# Patient Record
Sex: Male | Born: 1975 | Race: Black or African American | Hispanic: No | Marital: Single | Smoking: Never smoker
Health system: Southern US, Community
[De-identification: ages and names within clinical notes are randomized; demographics above are authoritative.]

---

## 1998-02-20 ENCOUNTER — Emergency Department (HOSPITAL_COMMUNITY): Admission: EM | Admit: 1998-02-20 | Discharge: 1998-02-20 | Payer: Self-pay | Admitting: Internal Medicine

## 2020-02-23 ENCOUNTER — Emergency Department (HOSPITAL_COMMUNITY)
Admission: EM | Admit: 2020-02-23 | Discharge: 2020-02-23 | Disposition: A | Discharge status: Home / Self Care | Payer: Self-pay | Attending: Emergency Medicine | Admitting: Emergency Medicine

## 2020-02-23 ENCOUNTER — Encounter (HOSPITAL_COMMUNITY): Payer: Self-pay

## 2020-02-23 DIAGNOSIS — K0889 Other specified disorders of teeth and supporting structures: Secondary | ICD-10-CM | POA: Insufficient documentation

## 2020-02-23 MED ORDER — PENICILLIN V POTASSIUM 500 MG PO TABS: 500.0000 mg | ORAL_TABLET | Freq: Four times a day (QID) | ORAL | 0 refills | Status: AC

## 2020-02-23 MED ORDER — MELOXICAM 7.5 MG PO TABS: 7.5000 mg | ORAL_TABLET | Freq: Every day | ORAL | 0 refills | Status: AC

## 2020-02-23 NOTE — ED Triage Notes (Signed)
Pt arrives POV for eval of L sided dental/tooth pain onset yesterday afternoon. Reports radiating to L ear, jaw, eye. States he took 4 tylenol today

## 2020-02-23 NOTE — ED Provider Notes (Signed)
[facility] EMERGENCY DEPARTMENT Provider Note   CSN: [id number] Arrival date & time: 02/23/20  1330     History Chief Complaint  Patient presents with  . Dental Pain    Glenn Barnes is a 44 y.o. male.  44yo male with complaint of left upper dental pain onset yesterday, states his tooth cracked.  Denies trauma, fever, drainage.  Patient called his dentist but was advised you need any antibiotic before they could see him.  No allergies, took Tylenol without improvement.  No other complaints or concerns.        History reviewed. No pertinent past medical history.  There are no problems to display for this patient.   History reviewed. No pertinent surgical history.     No family history on file.  Social History   Tobacco Use  . Smoking status: Never Smoker  . Smokeless tobacco: Never Used  Substance Use Topics  . Alcohol use: Yes  . Drug use: Yes    Types: Marijuana    Home Medications Prior to Admission medications   Medication Sig Start Date End Date Taking? Authorizing Provider  meloxicam (MOBIC) 7.5 MG tablet Take 1 tablet (7.5 mg total) by mouth daily for 10 days. 02/23/20 03/04/20  Army Melia A, PA-C  penicillin v potassium (VEETID) 500 MG tablet Take 1 tablet (500 mg total) by mouth 4 (four) times daily for 7 days. 02/23/20 03/01/20  Jeannie Fend, PA-C    Allergies    Patient has no known allergies.  Review of Systems   Review of Systems  Constitutional: Negative for fever.  HENT: Positive for dental problem. Negative for ear pain, facial swelling, trouble swallowing and voice change.   Gastrointestinal: Negative for nausea and vomiting.  Musculoskeletal: Negative for neck pain.  Skin: Negative for rash and wound.  Allergic/Immunologic: Negative for immunocompromised state.  Neurological: Negative for headaches.  Hematological: Negative for adenopathy.  Psychiatric/Behavioral: Negative for confusion.  All other systems reviewed  and are negative.   Physical Exam Updated Vital Signs BP (!) 139/91   Pulse 64   Temp 98.2 F (36.8 C) (Oral)   Resp 15   Ht 5\' 8"  (1.727 m)   Wt 102.1 kg   SpO2 100%   BMI 34.21 kg/m   Physical Exam Vitals and nursing note reviewed.  Constitutional:      General: He is not in acute distress.    Appearance: He is well-developed. He is not diaphoretic.  HENT:     Head: Normocephalic and atraumatic.     Jaw: No trismus.     Nose: Nose normal.     Mouth/Throat:     Mouth: Mucous membranes are moist.     Dentition: Normal dentition. No gingival swelling, dental caries or dental abscesses.     Pharynx: Uvula midline. No uvula swelling.   Pulmonary:     Effort: Pulmonary effort is normal.  Musculoskeletal:     Cervical back: Normal range of motion and neck supple. No tenderness.  Skin:    General: Skin is warm and dry.     Findings: No erythema or rash.  Neurological:     Mental Status: He is alert and oriented to person, place, and time.  Psychiatric:        Behavior: Behavior normal.     ED Results / Procedures / Treatments   Labs (all labs ordered are listed, but only abnormal results are displayed) Labs Reviewed - No data to display  EKG None  Radiology No results found.  Procedures Procedures (including critical care time)  Medications Ordered in ED Medications - No data to display  ED Course  I have reviewed the triage vital signs and the nursing notes.  Pertinent labs & imaging results that were available during my care of the patient were reviewed by me and considered in my medical decision making (see chart for details).    MDM Rules/Calculators/A&P                          Final Clinical Impression(s) / ED Diagnoses Final diagnoses:  Pain, dental    Rx / DC Orders ED Discharge Orders         Ordered    penicillin v potassium (VEETID) 500 MG tablet  4 times daily     Discontinue  Reprint     02/23/20 1900    meloxicam (MOBIC) 7.5 MG  tablet  Daily     Discontinue  Reprint     02/23/20 1900           Jeannie Fend, PA-C 02/23/20 1904    Arby Barrette, MD 02/24/20 [phone]

## 2021-02-19 ENCOUNTER — Encounter (HOSPITAL_COMMUNITY): Payer: Self-pay | Admitting: Emergency Medicine

## 2021-02-19 ENCOUNTER — Emergency Department (HOSPITAL_COMMUNITY): Payer: Self-pay

## 2021-02-19 ENCOUNTER — Ambulatory Visit (HOSPITAL_COMMUNITY)
Admission: EM | Admit: 2021-02-19 | Discharge: 2021-02-19 | Disposition: A | Discharge status: Home / Self Care | Payer: Self-pay | Attending: Emergency Medicine | Admitting: Emergency Medicine

## 2021-02-19 ENCOUNTER — Other Ambulatory Visit: Payer: Self-pay

## 2021-02-19 ENCOUNTER — Encounter (HOSPITAL_COMMUNITY)
Admission: EM | Disposition: A | Discharge status: Home / Self Care | Payer: Self-pay | Source: Home / Self Care | Attending: Emergency Medicine

## 2021-02-19 ENCOUNTER — Emergency Department (HOSPITAL_COMMUNITY): Payer: Self-pay | Admitting: Anesthesiology

## 2021-02-19 DIAGNOSIS — Y9355 Activity, bike riding: Secondary | ICD-10-CM | POA: Insufficient documentation

## 2021-02-19 DIAGNOSIS — Z20822 Contact with and (suspected) exposure to covid-19: Secondary | ICD-10-CM | POA: Insufficient documentation

## 2021-02-19 DIAGNOSIS — T148XXA Other injury of unspecified body region, initial encounter: Secondary | ICD-10-CM

## 2021-02-19 DIAGNOSIS — S52391A Other fracture of shaft of radius, right arm, initial encounter for closed fracture: Secondary | ICD-10-CM

## 2021-02-19 DIAGNOSIS — S52371A Galeazzi's fracture of right radius, initial encounter for closed fracture: Secondary | ICD-10-CM | POA: Insufficient documentation

## 2021-02-19 HISTORY — PX: OPEN REDUCTION INTERNAL FIXATION (ORIF) DISTAL RADIAL FRACTURE: SHX5989

## 2021-02-19 LAB — RESP PANEL BY RT-PCR (FLU A&B, COVID) ARPGX2
Influenza A by PCR: NEGATIVE
Influenza B by PCR: NEGATIVE
SARS Coronavirus 2 by RT PCR: NEGATIVE

## 2021-02-19 LAB — CBC WITH DIFFERENTIAL/PLATELET
Abs Immature Granulocytes: 0.02 10*3/uL (ref 0.00–0.07)
Basophils Absolute: 0 10*3/uL (ref 0.0–0.1)
Basophils Relative: 1 %
Eosinophils Absolute: 0.1 10*3/uL (ref 0.0–0.5)
Eosinophils Relative: 1 %
HCT: 48.6 % (ref 39.0–52.0)
Hemoglobin: 16.3 g/dL (ref 13.0–17.0)
Immature Granulocytes: 0 %
Lymphocytes Relative: 16 %
Lymphs Abs: 1 10*3/uL (ref 0.7–4.0)
MCH: 27.6 pg (ref 26.0–34.0)
MCHC: 33.5 g/dL (ref 30.0–36.0)
MCV: 82.4 fL (ref 80.0–100.0)
Monocytes Absolute: 0.4 10*3/uL (ref 0.1–1.0)
Monocytes Relative: 7 %
Neutro Abs: 4.5 10*3/uL (ref 1.7–7.7)
Neutrophils Relative %: 75 %
Platelets: 201 10*3/uL (ref 150–400)
RBC: 5.9 MIL/uL — ABNORMAL HIGH (ref 4.22–5.81)
RDW: 14.8 % (ref 11.5–15.5)
WBC: 6 10*3/uL (ref 4.0–10.5)
nRBC: 0 % (ref 0.0–0.2)

## 2021-02-19 LAB — BASIC METABOLIC PANEL
Anion gap: 10 (ref 5–15)
BUN: 13 mg/dL (ref 6–20)
CO2: 25 mmol/L (ref 22–32)
Calcium: 9.9 mg/dL (ref 8.9–10.3)
Chloride: 109 mmol/L (ref 98–111)
Creatinine, Ser: 1.25 mg/dL — ABNORMAL HIGH (ref 0.61–1.24)
GFR, Estimated: >60 mL/min (ref >60)
Glucose, Bld: 101 mg/dL — ABNORMAL HIGH (ref 70–99)
Potassium: 4.7 mmol/L (ref 3.5–5.1)
Sodium: 144 mmol/L (ref 135–145)

## 2021-02-19 SURGERY — OPEN REDUCTION INTERNAL FIXATION (ORIF) DISTAL RADIUS FRACTURE
Anesthesia: Monitor Anesthesia Care | Laterality: Right

## 2021-02-19 MED ORDER — MIDAZOLAM HCL 2 MG/2ML IJ SOLN
INTRAMUSCULAR | Status: AC
  Filled 2021-02-19: qty 2

## 2021-02-19 MED ORDER — ONDANSETRON HCL 4 MG/2ML IJ SOLN
INTRAMUSCULAR | Status: DC | PRN
  Administered 2021-02-19: 4 mg via INTRAVENOUS

## 2021-02-19 MED ORDER — CHLORHEXIDINE GLUCONATE 4 % EX LIQD: 60.0000 mL | Freq: Once | CUTANEOUS | Status: DC

## 2021-02-19 MED ORDER — BUPIVACAINE HCL (PF) 0.5 % IJ SOLN
INTRAMUSCULAR | Status: AC
  Filled 2021-02-19: qty 30

## 2021-02-19 MED ORDER — ACETAMINOPHEN 325 MG PO TABS: 650.0000 mg | ORAL_TABLET | Freq: Two times a day (BID) | ORAL | 0 refills | Status: AC

## 2021-02-19 MED ORDER — METHOCARBAMOL 500 MG PO TABS: 500.0000 mg | ORAL_TABLET | Freq: Three times a day (TID) | ORAL | 0 refills | Status: AC | PRN

## 2021-02-19 MED ORDER — DEXAMETHASONE SODIUM PHOSPHATE 10 MG/ML IJ SOLN
INTRAMUSCULAR | Status: DC | PRN
  Administered 2021-02-19: 5 mg via INTRAVENOUS

## 2021-02-19 MED ORDER — PROMETHAZINE HCL 25 MG/ML IJ SOLN: 6.2500 mg | INTRAMUSCULAR | Status: DC | PRN

## 2021-02-19 MED ORDER — DROPERIDOL 2.5 MG/ML IJ SOLN: 0.6250 mg | Freq: Once | INTRAMUSCULAR | Status: DC | PRN

## 2021-02-19 MED ORDER — MORPHINE SULFATE (PF) 4 MG/ML IV SOLN
8.0000 mg | Freq: Once | INTRAVENOUS | Status: AC
  Administered 2021-02-19: 8 mg via INTRAMUSCULAR
  Filled 2021-02-19: qty 2

## 2021-02-19 MED ORDER — ONDANSETRON 4 MG PO TBDP: 4.0000 mg | ORAL_TABLET | Freq: Three times a day (TID) | ORAL | 0 refills | Status: AC | PRN

## 2021-02-19 MED ORDER — FENTANYL CITRATE (PF) 100 MCG/2ML IJ SOLN: 25.0000 ug | INTRAMUSCULAR | Status: DC | PRN

## 2021-02-19 MED ORDER — ROPIVACAINE HCL 5 MG/ML IJ SOLN
INTRAMUSCULAR | Status: DC | PRN
  Administered 2021-02-19: 30 mL via PERINEURAL

## 2021-02-19 MED ORDER — LACTATED RINGERS IV SOLN: INTRAVENOUS | Status: DC | PRN

## 2021-02-19 MED ORDER — OXYCODONE HCL 5 MG/5ML PO SOLN: 5.0000 mg | Freq: Once | ORAL | Status: DC | PRN

## 2021-02-19 MED ORDER — LIDOCAINE HCL (CARDIAC) PF 100 MG/5ML IV SOSY
PREFILLED_SYRINGE | INTRAVENOUS | Status: DC | PRN
  Administered 2021-02-19: 60 mg via INTRAVENOUS

## 2021-02-19 MED ORDER — PROPOFOL 10 MG/ML IV BOLUS
INTRAVENOUS | Status: DC | PRN
  Administered 2021-02-19: 40 mg via INTRAVENOUS

## 2021-02-19 MED ORDER — OXYCODONE-ACETAMINOPHEN 5-325 MG PO TABS: 1.0000 | ORAL_TABLET | Freq: Four times a day (QID) | ORAL | 0 refills | Status: AC | PRN

## 2021-02-19 MED ORDER — FENTANYL CITRATE (PF) 100 MCG/2ML IJ SOLN
INTRAMUSCULAR | Status: AC
  Filled 2021-02-19: qty 2

## 2021-02-19 MED ORDER — PROPOFOL 500 MG/50ML IV EMUL
INTRAVENOUS | Status: DC | PRN
  Administered 2021-02-19: 80 ug/kg/min via INTRAVENOUS

## 2021-02-19 MED ORDER — CEFAZOLIN SODIUM-DEXTROSE 2-4 GM/100ML-% IV SOLN
INTRAVENOUS | Status: AC
  Filled 2021-02-19: qty 100

## 2021-02-19 MED ORDER — CEFAZOLIN SODIUM-DEXTROSE 2-4 GM/100ML-% IV SOLN
2.0000 g | INTRAVENOUS | Status: AC
  Administered 2021-02-19: 2 g via INTRAVENOUS

## 2021-02-19 MED ORDER — SODIUM CHLORIDE 0.9 % IV SOLN: INTRAVENOUS | Status: DC

## 2021-02-19 MED ORDER — OXYCODONE HCL 5 MG PO TABS: 5.0000 mg | ORAL_TABLET | Freq: Once | ORAL | Status: DC | PRN

## 2021-02-19 MED ORDER — POVIDONE-IODINE 10 % EX SWAB: 2.0000 "application " | Freq: Once | CUTANEOUS | Status: DC

## 2021-02-19 MED ORDER — MORPHINE SULFATE (PF) 4 MG/ML IV SOLN
6.0000 mg | Freq: Once | INTRAVENOUS | Status: AC
  Administered 2021-02-19: 6 mg via INTRAMUSCULAR
  Filled 2021-02-19: qty 2

## 2021-02-19 MED ORDER — MIDAZOLAM HCL 2 MG/2ML IJ SOLN
INTRAMUSCULAR | Status: DC | PRN
  Administered 2021-02-19: 2 mg via INTRAVENOUS

## 2021-02-19 MED ORDER — FENTANYL CITRATE (PF) 250 MCG/5ML IJ SOLN
INTRAMUSCULAR | Status: DC | PRN
  Administered 2021-02-19 (×2): 50 ug via INTRAVENOUS

## 2021-02-19 MED ORDER — 0.9 % SODIUM CHLORIDE (POUR BTL) OPTIME
TOPICAL | Status: DC | PRN
  Administered 2021-02-19: 1000 mL

## 2021-02-19 SURGICAL SUPPLY — 70 items
BAG COUNTER SPONGE SURGICOUNT (BAG) ×2 IMPLANT
BAG DECANTER FOR FLEXI CONT (MISCELLANEOUS) IMPLANT
BENZOIN TINCTURE PRP APPL 2/3 (GAUZE/BANDAGES/DRESSINGS) ×2 IMPLANT
BIT DRILL 2.5X2.75 QC CALB (BIT) ×2 IMPLANT
BIT DRILL CALIBRATED 2.7 (BIT) ×2 IMPLANT
BLADE SURG 15 STRL LF DISP TIS (BLADE) ×2 IMPLANT
BLADE SURG 15 STRL SS (BLADE) ×4
BNDG CONFORM 4 STRL LF (GAUZE/BANDAGES/DRESSINGS) ×2 IMPLANT
BNDG ELASTIC 3X5.8 VLCR STR LF (GAUZE/BANDAGES/DRESSINGS) ×2 IMPLANT
BNDG ELASTIC 4X5.8 VLCR STR LF (GAUZE/BANDAGES/DRESSINGS) ×2 IMPLANT
BNDG ESMARK 4X9 LF (GAUZE/BANDAGES/DRESSINGS) ×2 IMPLANT
BNDG PLASTER X FAST 3X3 WHT LF (CAST SUPPLIES) IMPLANT
CANISTER SUCT 3000ML PPV (MISCELLANEOUS) IMPLANT
COVER BACK TABLE 60X90IN (DRAPES) ×2 IMPLANT
CUFF TOURN SGL QUICK 18X4 (TOURNIQUET CUFF) IMPLANT
CUFF TOURN SGL QUICK 24 (TOURNIQUET CUFF) ×2
CUFF TRNQT CYL 24X4X16.5-23 (TOURNIQUET CUFF) ×1 IMPLANT
DECANTER SPIKE VIAL GLASS SM (MISCELLANEOUS) IMPLANT
DRAPE EXTREMITY T 121X128X90 (DISPOSABLE) ×2 IMPLANT
DRAPE OEC MINIVIEW 54X84 (DRAPES) ×2 IMPLANT
DRAPE SURG 17X23 STRL (DRAPES) ×2 IMPLANT
DRSG EMULSION OIL 3X3 NADH (GAUZE/BANDAGES/DRESSINGS) IMPLANT
DRSG PAD ABDOMINAL 8X10 ST (GAUZE/BANDAGES/DRESSINGS) ×2 IMPLANT
DURAPREP 26ML APPLICATOR (WOUND CARE) ×2 IMPLANT
ELECT REM PT RETURN 15FT ADLT (MISCELLANEOUS) ×2 IMPLANT
GAUZE SPONGE 4X4 12PLY STRL (GAUZE/BANDAGES/DRESSINGS) ×2 IMPLANT
GLOVE SRG 8 PF TXTR STRL LF DI (GLOVE) ×2 IMPLANT
GLOVE SURG LTX SZ7.5 (GLOVE) ×4 IMPLANT
GLOVE SURG UNDER POLY LF SZ8 (GLOVE) ×4
GOWN SPEC L4 XLG W/TWL (GOWN DISPOSABLE) ×2 IMPLANT
GOWN STRL REUS W/TWL LRG LVL3 (GOWN DISPOSABLE) ×4 IMPLANT
K-WIRE DBL TROCAR .062X4 (WIRE) ×2
KIT BASIN OR (CUSTOM PROCEDURE TRAY) ×2 IMPLANT
KWIRE DBL TROCAR .062X4 (WIRE) ×1 IMPLANT
NEEDLE HYPO 22GX1.5 SAFETY (NEEDLE) ×2 IMPLANT
NS IRRIG 1000ML POUR BTL (IV SOLUTION) ×2 IMPLANT
PAD CAST 3X4 CTTN HI CHSV (CAST SUPPLIES) ×1 IMPLANT
PADDING CAST ABS 4INX4YD NS (CAST SUPPLIES) ×1
PADDING CAST ABS COTTON 4X4 ST (CAST SUPPLIES) ×1 IMPLANT
PADDING CAST COTTON 3X4 STRL (CAST SUPPLIES) ×2
PADDING UNDERCAST 2 STRL (CAST SUPPLIES)
PADDING UNDERCAST 2X4 STRL (CAST SUPPLIES) IMPLANT
PENCIL SMOKE EVACUATOR (MISCELLANEOUS) ×2 IMPLANT
PLATE LOCK COMP 8H 3.5 FOOT (Plate) ×2 IMPLANT
SCREW CORTICAL 3.5MM  16MM (Screw) ×8 IMPLANT
SCREW CORTICAL 3.5MM 16MM (Screw) ×4 IMPLANT
SCREW LOCK CORT STAR 3.5X10 (Screw) ×2 IMPLANT
SCREW LOCK CORT STAR 3.5X12 (Screw) ×8 IMPLANT
SPLINT FIBERGLASS 4X30 (CAST SUPPLIES) ×2 IMPLANT
SPLINT PLASTER CAST XFAST 4X15 (CAST SUPPLIES) IMPLANT
SPLINT PLASTER XTRA FAST SET 4 (CAST SUPPLIES)
STOCKINETTE 4X48 STRL (DRAPES) ×2 IMPLANT
STRIP CLOSURE SKIN 1/2X4 (GAUZE/BANDAGES/DRESSINGS) IMPLANT
SUCTION FRAZIER HANDLE 10FR (MISCELLANEOUS)
SUCTION TUBE FRAZIER 10FR DISP (MISCELLANEOUS) IMPLANT
SUT BONE WAX W31G (SUTURE) IMPLANT
SUT ETHILON 3 0 FSL (SUTURE) ×2 IMPLANT
SUT MNCRL AB 3-0 PS2 18 (SUTURE) ×2 IMPLANT
SUT VIC AB 1 CT1 27 (SUTURE)
SUT VIC AB 1 CT1 27XBRD ANBCTR (SUTURE) IMPLANT
SUT VIC AB 2-0 CT1 27 (SUTURE) ×2
SUT VIC AB 2-0 CT1 TAPERPNT 27 (SUTURE) ×1 IMPLANT
SUT VIC AB 2-0 SH 18 (SUTURE) ×2 IMPLANT
SUT VIC AB 3-0 FS2 27 (SUTURE) IMPLANT
SUT VICRYL 4-0 PS2 18IN ABS (SUTURE) ×2 IMPLANT
SYR BULB EAR ULCER 3OZ GRN STR (SYRINGE) ×2 IMPLANT
SYR CONTROL 10ML LL (SYRINGE) IMPLANT
TOWEL OR 17X26 10 PK STRL BLUE (TOWEL DISPOSABLE) ×2 IMPLANT
TUBE CONNECTING 20X1/4 (TUBING) ×2 IMPLANT
UNDERPAD 30X36 HEAVY ABSORB (UNDERPADS AND DIAPERS) ×2 IMPLANT

## 2021-02-19 NOTE — Transfer of Care (Signed)
Immediate Anesthesia Transfer of Care Note  Patient: Glenn Barnes  Procedure(s) Performed: OPEN REDUCTION INTERNAL FIXATION (ORIF) FOREARM (Right)  Patient Location: PACU  Anesthesia Type:MAC combined with regional for post-op pain  Level of Consciousness: awake, drowsy and patient cooperative  Airway & Oxygen Therapy: Patient Spontanous Breathing and Patient connected to face mask oxygen  Post-op Assessment: Report given to RN and Post -op Vital signs reviewed and stable  Post vital signs: Reviewed and stable  Last Vitals:  Vitals Value Taken Time  BP 120/72 02/19/21 2052  Temp    Pulse 73 02/19/21 2054  Resp 21 02/19/21 2054  SpO2 94 % 02/19/21 2054  Vitals shown include unvalidated device data.  Last Pain:  Vitals:   02/19/21 1714  TempSrc:   PainSc: 0-No pain         Complications: No notable events documented.

## 2021-02-19 NOTE — Anesthesia Procedure Notes (Signed)
Anesthesia Regional Block: Supraclavicular block   Pre-Anesthetic Checklist: , timeout performed,  Correct Patient, Correct Site, Correct Laterality,  Correct Procedure, Correct Position, site marked,  Risks and benefits discussed,  Surgical consent,  Pre-op evaluation,  At surgeon's request and post-op pain management  Laterality: Right  Prep: chloraprep       Needles:  Injection technique: Single-shot  Needle Type: Echogenic Stimulator Needle     Needle Length: 10cm  Needle Gauge: 20     Additional Needles:   Procedures:,,,, ultrasound used (permanent image in chart),,    Narrative:  Start time: 02/19/2021 6:50 PM End time: 02/19/2021 6:55 PM Injection made incrementally with aspirations every 5 mL.  Performed by: Personally  Anesthesiologist: Mellody Dance, MD  Additional Notes: A functioning IV was confirmed and monitors were applied.  Sterile prep and drape, hand hygiene and sterile gloves were used.  Negative aspiration and test dose prior to incremental administration of local anesthetic. The patient tolerated the procedure well.Ultrasound  guidance: relevant anatomy identified, needle position confirmed, local anesthetic spread visualized around nerve(s), vascular puncture avoided.  Image printed for medical record.
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
## 2021-02-19 NOTE — Discharge Instructions (Signed)
General Discharge Instructions  Orthopaedic Injuries:  Right radius fracture with Right distal radioulnar joint dislocation treated with open reduction and internal fixation and splinting   WEIGHT BEARING STATUS: Nonweightbearing right arm. No lifting with right arm   RANGE OF MOTION/ACTIVITY:finger motion as tolerated. No elbow or forearm motion. Sling on at all times   Wound Care: do not remove splint. Keep splint clean and dry    Diet: as you were eating previously.  Can use over the counter stool softeners and bowel preparations, such as Miralax, to help with bowel movements.  Narcotics can be constipating.  Be sure to drink plenty of fluids  PAIN MEDICATION USE AND EXPECTATIONS  You have likely been given narcotic medications to help control your pain.  After a traumatic event that results in an fracture (broken bone) with or without surgery, it is ok to use narcotic pain medications to help control one's pain.  We understand that everyone responds to pain differently and each individual patient will be evaluated on a regular basis for the continued need for narcotic medications. Ideally, narcotic medication use should last no more than 6-8 weeks (coinciding with fracture healing).   As a patient it is your responsibility as well to monitor narcotic medication use and report the amount and frequency you use these medications when you come to your office visit.   We would also advise that if you are using narcotic medications, you should take a dose prior to therapy to maximize you participation.  IF YOU ARE ON NARCOTIC MEDICATIONS IT IS NOT PERMISSIBLE TO OPERATE A MOTOR VEHICLE (MOTORCYCLE/CAR/TRUCK/MOPED) OR HEAVY MACHINERY DO NOT MIX NARCOTICS WITH OTHER CNS (CENTRAL NERVOUS SYSTEM) DEPRESSANTS SUCH AS ALCOHOL   POST-OPERATIVE OPIOID TAPER INSTRUCTIONS: It is important to wean off of your opioid medication as soon as possible. If you do not need pain medication after your surgery  it is ok to stop day one. Opioids include: Codeine, Hydrocodone(Norco, Vicodin), Oxycodone(Percocet, oxycontin) and hydromorphone amongst others.  Long term and even short term use of opiods can cause: Increased pain response Dependence Constipation Depression Respiratory depression And more.  Withdrawal symptoms can include Flu like symptoms Nausea, vomiting And more Techniques to manage these symptoms Hydrate well Eat regular healthy meals Stay active Use relaxation techniques(deep breathing, meditating, yoga) Do Not substitute Alcohol to help with tapering If you have been on opioids for less than two weeks and do not have pain than it is ok to stop all together.  Plan to wean off of opioids This plan should start within one week post op of your fracture surgery  Maintain the same interval or time between taking each dose and first decrease the dose.  Cut the total daily intake of opioids by one tablet each day Next start to increase the time between doses. The last dose that should be eliminated is the evening dose.    STOP SMOKING OR USING NICOTINE PRODUCTS!!!!  As discussed nicotine severely impairs your body's ability to heal surgical and traumatic wounds but also impairs bone healing.  Wounds and bone heal by forming microscopic blood vessels (angiogenesis) and nicotine is a vasoconstrictor (essentially, shrinks blood vessels).  Therefore, if vasoconstriction occurs to these microscopic blood vessels they essentially disappear and are unable to deliver necessary nutrients to the healing tissue.  This is one modifiable factor that you can do to dramatically increase your chances of healing your injury.    (This means no smoking, no nicotine gum, patches, etc)  DO NOT USE NONSTEROIDAL ANTI-INFLAMMATORY DRUGS (NSAID'S)  Using products such as Advil (ibuprofen), Aleve (naproxen), Motrin (ibuprofen) for additional pain control during fracture healing can delay and/or prevent the  healing response.  If you would like to take over the counter (OTC) medication, Tylenol (acetaminophen) is ok.  However, some narcotic medications that are given for pain control contain acetaminophen as well. Therefore, you should not exceed more than 4000 mg of tylenol in a day if you do not have liver disease.  Also note that there are may OTC medicines, such as cold medicines and allergy medicines that my contain tylenol as well.  If you have any questions about medications and/or interactions please ask your doctor/PA or your pharmacist.      ICE AND ELEVATE INJURED/OPERATIVE EXTREMITY  Using ice and elevating the injured extremity above your heart can help with swelling and pain control.  Icing in a pulsatile fashion, such as 20 minutes on and 20 minutes off, can be followed.    Do not place ice directly on skin. Make sure there is a barrier between to skin and the ice pack.    Using frozen items such as frozen peas works well as the conform nicely to the are that needs to be iced.  USE AN ACE WRAP OR TED HOSE FOR SWELLING CONTROL  In addition to icing and elevation, Ace wraps or TED hose are used to help limit and resolve swelling.  It is recommended to use Ace wraps or TED hose until you are informed to stop.    When using Ace Wraps start the wrapping distally (farthest away from the body) and wrap proximally (closer to the body)   Example: If you had surgery on your leg or thing and you do not have a splint on, start the ace wrap at the toes and work your way up to the thigh        If you had surgery on your upper extremity and do not have a splint on, start the ace wrap at your fingers and work your way up to the upper arm  IF YOU ARE IN A SPLINT OR CAST DO NOT REMOVE IT FOR ANY REASON   If your splint gets wet for any reason please contact the office immediately. You may shower in your splint or cast as long as you keep it dry.  This can be done by wrapping in a cast cover or garbage back  (or similar)  Do Not stick any thing down your splint or cast such as pencils, money, or hangers to try and scratch yourself with.  If you feel itchy take benadryl as prescribed on the bottle for itching  IF YOU ARE IN A CAM BOOT (BLACK BOOT)  You may remove boot periodically. Perform daily dressing changes as noted below.  Wash the liner of the boot regularly and wear a sock when wearing the boot. It is recommended that you sleep in the boot until told otherwise    Call office for the following: Temperature greater than 101F Persistent nausea and vomiting Severe uncontrolled pain Redness, tenderness, or signs of infection (pain, swelling, redness, odor or green/yellow discharge around the site) Difficulty breathing, headache or visual disturbances Hives Persistent dizziness or light-headedness Extreme fatigue Any other questions or concerns you may have after discharge  In an emergency, call 911 or go to an Emergency Department at a nearby hospital  HELPFUL INFORMATION  If you had a block, it will wear off between  8-24 hrs postop typically.  This is period when your pain may go from nearly zero to the pain you would have had postop without the block.  This is an abrupt transition but nothing dangerous is happening.  You may take an extra dose of narcotic when this happens.  You should wean off your narcotic medicines as soon as you are able.  Most patients will be off or using minimal narcotics before their first postop appointment.   We suggest you use the pain medication the first night prior to going to bed, in order to ease any pain when the anesthesia wears off. You should avoid taking pain medications on an empty stomach as it will make you nauseous.  Do not drink alcoholic beverages or take illicit drugs when taking pain medications.  In most states it is against the law to drive while you are in a splint or sling.  And certainly against the law to drive while taking  narcotics.  You may return to work/school in the next couple of days when you feel up to it.   Pain medication may make you constipated.  Below are a few solutions to try in this order: Decrease the amount of pain medication if you aren't having pain. Drink lots of decaffeinated fluids. Drink prune juice and/or each dried prunes  If the first 3 don't work start with additional solutions Take Colace - an over-the-counter stool softener Take Senokot - an over-the-counter laxative Take Miralax - a stronger over-the-counter laxative
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
## 2021-02-19 NOTE — Anesthesia Preprocedure Evaluation (Addendum)
Anesthesia Evaluation  Patient identified by MRN, date of birth, ID band Patient awake    Reviewed: Allergy & Precautions, NPO status , Patient's Chart, lab work & pertinent test results  Airway Mallampati: I  TM Distance: >3 FB Neck ROM: Full    Dental no notable dental hx.    Pulmonary neg pulmonary ROS,    Pulmonary exam normal breath sounds clear to auscultation       Cardiovascular negative cardio ROS Normal cardiovascular exam Rhythm:Regular Rate:Normal     Neuro/Psych negative neurological ROS  negative psych ROS   GI/Hepatic negative GI ROS, Neg liver ROS,   Endo/Other  obesity  Renal/GU negative Renal ROS  negative genitourinary   Musculoskeletal negative musculoskeletal ROS (+)   Abdominal   Peds negative pediatric ROS (+)  Hematology negative hematology ROS (+)   Anesthesia Other Findings Right forearm fracture  Reproductive/Obstetrics negative OB ROS                           Anesthesia Physical Anesthesia Plan  ASA: 2 and emergent  Anesthesia Plan: MAC and Regional   Post-op Pain Management:    Induction:   PONV Risk Score and Plan: 1 and Treatment may vary due to age or medical condition, Midazolam, Ondansetron, Dexamethasone, Propofol infusion and TIVA  Airway Management Planned: Natural Airway and Simple Face Mask  Additional Equipment:   Intra-op Plan:   Post-operative Plan:   Informed Consent: I have reviewed the patients History and Physical, chart, labs and discussed the procedure including the risks, benefits and alternatives for the proposed anesthesia with the patient or authorized representative who has indicated his/her understanding and acceptance.     Dental advisory given  Plan Discussed with: CRNA, Anesthesiologist and Surgeon  Anesthesia Plan Comments: (Supraclavicular block + Propofol gtt. GA/LMA as backup plan. Norton Blizzard, MD  )       Anesthesia Quick Evaluation
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
## 2021-02-19 NOTE — ED Notes (Signed)
Pt unable to tolerate x-ray at this time. Administered pain medication and x-ray will come back in 10-39m.

## 2021-02-19 NOTE — ED Notes (Signed)
OR called and they are ready for patient to be brought down.

## 2021-02-19 NOTE — Anesthesia Postprocedure Evaluation (Signed)
Anesthesia Post Note  Patient: Radio producer  Procedure(s) Performed: OPEN REDUCTION INTERNAL FIXATION (ORIF) FOREARM (Right)     Patient location during evaluation: PACU Anesthesia Type: Regional and MAC Level of consciousness: awake and alert Pain management: pain level controlled Vital Signs Assessment: post-procedure vital signs reviewed and stable Respiratory status: spontaneous breathing and respiratory function stable Cardiovascular status: stable Postop Assessment: no apparent nausea or vomiting Anesthetic complications: no   No notable events documented.  Last Vitals:  Vitals:   02/19/21 2052 02/19/21 2100  BP: 120/72 109/81  Pulse: 77 81  Resp: (!) 21 13  Temp: (!) 36.3 C   SpO2: 95% 96%    Last Pain:  Vitals:   02/19/21 2100  TempSrc:   PainSc: Asleep                 Merlinda Frederick
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
## 2021-02-19 NOTE — ED Notes (Signed)
Pr reports last food ~2300 last night, small sips of water this morning around 0700.

## 2021-02-19 NOTE — Brief Op Note (Signed)
02/19/2021  8:27 PM  PATIENT:  Tamera Reason  45 y.o. male  PRE-OPERATIVE DIAGNOSIS:  RIGHT DISTAL/MID RADIUS FRACTURE  POST-OPERATIVE DIAGNOSIS:  RIGHT DISTAL/MID RADIUS FRACTURE  PROCEDURE:  Procedure(s): OPEN REDUCTION INTERNAL FIXATION (ORIF) FOREARM (Right)  SURGEON:  Surgeon(s) and Role:    Jodi Geralds, MD - Primary  PHYSICIAN ASSISTANT:   ASSISTANTS: keith paul   ANESTHESIA:   regional  EBL:  5 mL   BLOOD ADMINISTERED:none  DRAINS: none   LOCAL MEDICATIONS USED:  NONE  SPECIMEN:  No Specimen  DISPOSITION OF SPECIMEN:  N/A  COUNTS:  YES  TOURNIQUET:  @60  min  DICTATION: .Other Dictation: Dictation Number  PLAN OF CARE: Discharge to home after PACU  PATIENT DISPOSITION:  PACU - hemodynamically stable.   Delay start of Pharmacological VTE agent (>24hrs) due to surgical blood loss or risk of bleeding: no

## 2021-03-04 ENCOUNTER — Encounter (HOSPITAL_COMMUNITY): Payer: Self-pay | Admitting: Orthopedic Surgery
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
## 2022-09-18 ENCOUNTER — Other Ambulatory Visit: Payer: Self-pay

## 2022-09-18 ENCOUNTER — Emergency Department (HOSPITAL_BASED_OUTPATIENT_CLINIC_OR_DEPARTMENT_OTHER)
Admission: EM | Admit: 2022-09-18 | Discharge: 2022-09-18 | Disposition: A | Discharge status: Home / Self Care | Payer: BC Managed Care – PPO | Attending: Emergency Medicine | Admitting: Emergency Medicine

## 2022-09-18 DIAGNOSIS — K0889 Other specified disorders of teeth and supporting structures: Secondary | ICD-10-CM | POA: Diagnosis present

## 2022-09-18 DIAGNOSIS — T85848A Pain due to other internal prosthetic devices, implants and grafts, initial encounter: Secondary | ICD-10-CM | POA: Insufficient documentation

## 2022-09-18 MED ORDER — IBUPROFEN 800 MG PO TABS: 800.0000 mg | ORAL_TABLET | Freq: Three times a day (TID) | ORAL | 0 refills | Status: AC

## 2022-09-18 MED ORDER — AMOXICILLIN-POT CLAVULANATE 875-125 MG PO TABS: 1.0000 | ORAL_TABLET | Freq: Two times a day (BID) | ORAL | 0 refills | Status: AC

## 2022-09-18 MED ORDER — HYDROCODONE-ACETAMINOPHEN 5-325 MG PO TABS: 1.0000 | ORAL_TABLET | Freq: Four times a day (QID) | ORAL | 0 refills | Status: AC | PRN

## 2022-09-18 NOTE — ED Provider Notes (Signed)
[facility] Provider Note   CSN: [id number] Arrival date & time: 09/18/22  8119     History  Chief Complaint  Patient presents with   Dental Pain    Glenn Barnes is a 47 y.o. male.  HPI   47 year old male presents emergency department left upper dental pain.  Patient recently had a right upper tooth extracted for similar discomfort.  He denies any injury to the tooth.  No obvious cavities/crack.  He has been taking over-the-counter medication without significant pain control.  Denies any facial swelling, throat swelling.  No difficulty speaking or swallowing.  Home Medications Prior to Admission medications   Medication Sig Start Date End Date Taking? Authorizing Provider  amoxicillin-clavulanate (AUGMENTIN) 875-125 MG tablet Take 1 tablet by mouth every 12 (twelve) hours. 09/18/22  Yes Ardine Iacovelli, Alvin Critchley, DO  HYDROcodone-acetaminophen (NORCO) 5-325 MG tablet Take 1 tablet by mouth every 6 (six) hours as needed for moderate pain. 09/18/22  Yes Emiel Kielty, Alvin Critchley, DO  ibuprofen (ADVIL) 800 MG tablet Take 1 tablet (800 mg total) by mouth 3 (three) times daily. 09/18/22  Yes Iverna Hammac, Alvin Critchley, DO  methocarbamol (ROBAXIN) 500 MG tablet Take 1 tablet (500 mg total) by mouth every 8 (eight) hours as needed for muscle spasms. 02/19/21   Ainsley Spinner, PA-C  Multiple Vitamins-Minerals (MENS MULTI VITAMIN & MINERAL) TABS Take 1 tablet by mouth daily.    [provider]  ondansetron (ZOFRAN ODT) 4 MG disintegrating tablet Take 1 tablet (4 mg total) by mouth every 8 (eight) hours as needed for nausea or vomiting. 02/19/21   Ainsley Spinner, PA-C      Allergies    Patient has no known allergies.    Review of Systems   Review of Systems  Constitutional:  Negative for fever.  HENT:  Positive for dental problem. Negative for trouble swallowing and voice change.   Respiratory:  Negative for shortness of breath.   Musculoskeletal:  Negative for neck pain.     Physical Exam Updated Vital Signs BP (!) 156/101 (BP Location: Right Arm)   Pulse 81   Temp 98.4 F (36.9 C) (Oral)   Resp 18   Ht 5\' 8"  (1.727 m)   Wt 99.8 kg   SpO2 99%   BMI 33.45 kg/m  Physical Exam Vitals and nursing note reviewed.  Constitutional:      Appearance: Normal appearance.  HENT:     Head: Normocephalic.     Mouth/Throat:     Mouth: Mucous membranes are moist.   Cardiovascular:     Rate and Rhythm: Normal rate.  Pulmonary:     Effort: Pulmonary effort is normal. No respiratory distress.  Skin:    General: Skin is warm.  Neurological:     Mental Status: He is alert and oriented to person, place, and time. Mental status is at baseline.  Psychiatric:        Mood and Affect: Mood normal.     ED Results / Procedures / Treatments   Labs (all labs ordered are listed, but only abnormal results are displayed) Labs Reviewed - No data to display  EKG None  Radiology No results found.  Procedures Procedures    Medications Ordered in ED Medications - No data to display  ED Course/ Medical Decision Making/ A&P                             Medical Decision  Making Risk Prescription drug management.   47 year old male coming in with left upper tooth pain.  No noted cavities/cracking but there is pain to percussion.  No significant gum swelling, facial swelling.  He is swallowing freely.  Had a similar issue with a tooth on the right upper portion that was extracted.  Will plan to treat and refer to dental.  Patient at this time appears safe and stable for discharge and close outpatient follow up. Discharge plan and strict return to ED precautions discussed, patient verbalizes understanding and agreement.        Final Clinical Impression(s) / ED Diagnoses Final diagnoses:  Dental implant pain, initial encounter    Rx / DC Orders ED Discharge Orders          Ordered    HYDROcodone-acetaminophen (NORCO) 5-325 MG tablet  Every 6 hours PRN         09/18/22 0914    amoxicillin-clavulanate (AUGMENTIN) 875-125 MG tablet  Every 12 hours        09/18/22 0914    ibuprofen (ADVIL) 800 MG tablet  3 times daily        09/18/22 0914              Patricio Popwell, Alvin Critchley, DO 09/18/22 6761

## 2022-09-18 NOTE — ED Triage Notes (Signed)
Pt from home and c/o of tooth pain that started on Sunday.

## 2022-09-18 NOTE — Discharge Instructions (Addendum)
Follow-up with a dentist to have the tooth evaluated.  Take antibiotic as prescribed.  Take ibuprofen as directed, take stronger pain medicine as needed.  Do not mix this pain medication with alcohol or other sedating medications. Do not drive or do heavy physical activity until you know how this medication affects you.  It may cause drowsiness.
